# Patient Record
Sex: Male | Born: 1985 | Race: Black or African American | Hispanic: No | Marital: Single | Smoking: Current every day smoker
Health system: Southern US, Community
[De-identification: ages and names within clinical notes are randomized; demographics above are authoritative.]

---
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
## 2004-01-05 ENCOUNTER — Emergency Department (HOSPITAL_COMMUNITY): Admission: EM | Admit: 2004-01-05 | Discharge: 2004-01-05 | Payer: Self-pay

## 2004-07-22 ENCOUNTER — Emergency Department (HOSPITAL_COMMUNITY): Admission: EM | Admit: 2004-07-22 | Discharge: 2004-07-22 | Payer: Self-pay | Admitting: *Deleted

## 2004-08-10 ENCOUNTER — Emergency Department (HOSPITAL_COMMUNITY): Admission: EM | Admit: 2004-08-10 | Discharge: 2004-08-10 | Payer: Self-pay | Admitting: Emergency Medicine

## 2004-08-23 ENCOUNTER — Emergency Department (HOSPITAL_COMMUNITY): Admission: EM | Admit: 2004-08-23 | Discharge: 2004-08-23 | Payer: Self-pay | Admitting: Emergency Medicine

## 2004-09-15 ENCOUNTER — Emergency Department (HOSPITAL_COMMUNITY): Admission: EM | Admit: 2004-09-15 | Discharge: 2004-09-15 | Payer: Self-pay | Admitting: Emergency Medicine

## 2004-12-02 ENCOUNTER — Emergency Department (HOSPITAL_COMMUNITY): Admission: EM | Admit: 2004-12-02 | Discharge: 2004-12-02 | Payer: Self-pay | Admitting: Emergency Medicine

## 2013-08-06 ENCOUNTER — Emergency Department (HOSPITAL_COMMUNITY)
Admission: EM | Admit: 2013-08-06 | Discharge: 2013-08-06 | Discharge status: Against Medical Advice | Payer: Self-pay | Attending: Emergency Medicine | Admitting: Emergency Medicine

## 2013-08-06 ENCOUNTER — Encounter (HOSPITAL_COMMUNITY): Payer: Self-pay | Admitting: Emergency Medicine

## 2013-08-06 DIAGNOSIS — J029 Acute pharyngitis, unspecified: Secondary | ICD-10-CM | POA: Insufficient documentation

## 2013-08-06 DIAGNOSIS — F17200 Nicotine dependence, unspecified, uncomplicated: Secondary | ICD-10-CM | POA: Insufficient documentation

## 2013-08-06 LAB — RAPID STREP SCREEN (MED CTR MEBANE ONLY): Streptococcus, Group A Screen (Direct): NEGATIVE

## 2013-08-06 NOTE — ED Notes (Signed)
Sore throat for 2 weeks.  No temp

## 2013-08-06 NOTE — ED Notes (Signed)
Pt states that he is not waiting any longer. Pt ambulated out of room and was causing a scene at the nursing station about wait for test results. Pt knew that strep test was pending. Pt decided to walk out without results. Pt refusing to sign AMA papers. PA aware. Fayrene FearingJames RN witness to patient leaving AMA without signing.

## 2013-08-06 NOTE — ED Notes (Signed)
Pt states sore throat for a week. Pt has tried to use throat spray and tried to use nyquil without working.

## 2013-08-08 LAB — CULTURE, GROUP A STREP

## 2013-10-01 ENCOUNTER — Emergency Department (HOSPITAL_COMMUNITY)
Admission: EM | Admit: 2013-10-01 | Discharge: 2013-10-01 | Discharge status: Against Medical Advice | Payer: Self-pay | Attending: Emergency Medicine | Admitting: Emergency Medicine

## 2013-10-01 ENCOUNTER — Encounter (HOSPITAL_COMMUNITY): Payer: Self-pay | Admitting: Emergency Medicine

## 2013-10-01 ENCOUNTER — Emergency Department (HOSPITAL_COMMUNITY): Payer: Self-pay

## 2013-10-01 DIAGNOSIS — Z23 Encounter for immunization: Secondary | ICD-10-CM | POA: Insufficient documentation

## 2013-10-01 DIAGNOSIS — S0180XA Unspecified open wound of other part of head, initial encounter: Secondary | ICD-10-CM | POA: Insufficient documentation

## 2013-10-01 DIAGNOSIS — F17200 Nicotine dependence, unspecified, uncomplicated: Secondary | ICD-10-CM | POA: Insufficient documentation

## 2013-10-01 DIAGNOSIS — F1010 Alcohol abuse, uncomplicated: Secondary | ICD-10-CM | POA: Insufficient documentation

## 2013-10-01 DIAGNOSIS — S0181XA Laceration without foreign body of other part of head, initial encounter: Secondary | ICD-10-CM

## 2013-10-01 DIAGNOSIS — F10920 Alcohol use, unspecified with intoxication, uncomplicated: Secondary | ICD-10-CM

## 2013-10-01 MED ORDER — TETANUS-DIPHTH-ACELL PERTUSSIS 5-2.5-18.5 LF-MCG/0.5 IM SUSP
0.5000 mL | Freq: Once | INTRAMUSCULAR | Status: AC
  Administered 2013-10-01: 0.5 mL via INTRAMUSCULAR
  Filled 2013-10-01: qty 0.5

## 2013-10-01 MED ORDER — BACITRACIN 500 UNIT/GM EX OINT
1.0000 "application " | TOPICAL_OINTMENT | Freq: Once | CUTANEOUS | Status: DC
  Filled 2013-10-01: qty 28.4
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
## 2013-10-01 NOTE — ED Notes (Signed)
Tori, PA-C, at the bedside. Dr. Hyacinth MeekerMiller at the bedside.

## 2013-10-01 NOTE — ED Provider Notes (Signed)
Medical screening examination/treatment/procedure(s) were conducted as a shared visit with non-physician practitioner(s) and myself.  I personally evaluated the patient during the encounter  Please see my separate respective documentation pertaining to this patient encounter  Clinical Impression: Laceration of forehead, Head injury  Vida RollerBrian D Kolden Dupee, MD 10/01/13 [phone]

## 2013-10-01 NOTE — ED Notes (Signed)
Patient resisting CT testing.  Robert Wallace at the bedside to explain the risks.

## 2013-10-01 NOTE — ED Notes (Signed)
Suture cart at the bedside.  

## 2013-10-01 NOTE — ED Notes (Signed)
Family present to attempt to convince patient to stay. Patient still insisting on leaving.  Leaves with [employer] present.

## 2013-10-01 NOTE — ED Notes (Signed)
Patient refusing to stay for treatment.  Preparing to leave AMA. Dr. Hyacinth MeekerMiller aware.

## 2013-10-01 NOTE — ED Notes (Signed)
Patient too agitated for visitors at this time.
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
## 2013-10-01 NOTE — ED Notes (Signed)
Dr. Miller at the bedside.  

## 2013-11-04 ENCOUNTER — Emergency Department (HOSPITAL_COMMUNITY)
Admission: EM | Admit: 2013-11-04 | Discharge: 2013-11-04 | Disposition: A | Discharge status: Home / Self Care | Payer: Self-pay | Attending: Emergency Medicine | Admitting: Emergency Medicine

## 2013-11-04 ENCOUNTER — Encounter (HOSPITAL_COMMUNITY): Payer: Self-pay | Admitting: Emergency Medicine

## 2013-11-04 DIAGNOSIS — R45850 Homicidal ideations: Secondary | ICD-10-CM

## 2013-11-04 DIAGNOSIS — F1020 Alcohol dependence, uncomplicated: Secondary | ICD-10-CM | POA: Diagnosis present

## 2013-11-04 DIAGNOSIS — F1210 Cannabis abuse, uncomplicated: Secondary | ICD-10-CM | POA: Insufficient documentation

## 2013-11-04 DIAGNOSIS — F1994 Other psychoactive substance use, unspecified with psychoactive substance-induced mood disorder: Secondary | ICD-10-CM

## 2013-11-04 DIAGNOSIS — F17200 Nicotine dependence, unspecified, uncomplicated: Secondary | ICD-10-CM | POA: Insufficient documentation

## 2013-11-04 DIAGNOSIS — R454 Irritability and anger: Secondary | ICD-10-CM

## 2013-11-04 DIAGNOSIS — F10230 Alcohol dependence with withdrawal, uncomplicated: Secondary | ICD-10-CM

## 2013-11-04 DIAGNOSIS — F1410 Cocaine abuse, uncomplicated: Secondary | ICD-10-CM

## 2013-11-04 DIAGNOSIS — F1910 Other psychoactive substance abuse, uncomplicated: Secondary | ICD-10-CM

## 2013-11-04 DIAGNOSIS — F10929 Alcohol use, unspecified with intoxication, unspecified: Secondary | ICD-10-CM

## 2013-11-04 DIAGNOSIS — F911 Conduct disorder, childhood-onset type: Secondary | ICD-10-CM | POA: Insufficient documentation

## 2013-11-04 DIAGNOSIS — F1010 Alcohol abuse, uncomplicated: Secondary | ICD-10-CM | POA: Diagnosis present

## 2013-11-04 LAB — CBC
HCT: 44 % (ref 39.0–52.0)
Hemoglobin: 15.1 g/dL (ref 13.0–17.0)
MCH: 30.6 pg (ref 26.0–34.0)
MCHC: 34.3 g/dL (ref 30.0–36.0)
MCV: 89.1 fL (ref 78.0–100.0)
Platelets: 289 10*3/uL (ref 150–400)
RBC: 4.94 MIL/uL (ref 4.22–5.81)
RDW: 13.8 % (ref 11.5–15.5)
WBC: 7.3 10*3/uL (ref 4.0–10.5)

## 2013-11-04 LAB — SALICYLATE LEVEL: Salicylate Lvl: <2 mg/dL — ABNORMAL LOW (ref 2.8–20.0)

## 2013-11-04 LAB — COMPREHENSIVE METABOLIC PANEL
ALT: 33 U/L (ref 0–53)
AST: 48 U/L — ABNORMAL HIGH (ref 0–37)
Albumin: 4 g/dL (ref 3.5–5.2)
Alkaline Phosphatase: 90 U/L (ref 39–117)
Anion gap: 16 — ABNORMAL HIGH (ref 5–15)
BUN: 12 mg/dL (ref 6–23)
CO2: 25 mEq/L (ref 19–32)
Calcium: 9 mg/dL (ref 8.4–10.5)
Chloride: 100 mEq/L (ref 96–112)
Creatinine, Ser: 1.16 mg/dL (ref 0.50–1.35)
GFR calc Af Amer: >90 mL/min (ref >90)
GFR calc non Af Amer: 83 mL/min — ABNORMAL LOW (ref >90)
Glucose, Bld: 93 mg/dL (ref 70–99)
Potassium: 4 mEq/L (ref 3.7–5.3)
Sodium: 141 mEq/L (ref 137–147)
Total Bilirubin: <0.2 mg/dL — ABNORMAL LOW (ref 0.3–1.2)
Total Protein: 8.1 g/dL (ref 6.0–8.3)

## 2013-11-04 LAB — RAPID URINE DRUG SCREEN, HOSP PERFORMED
Amphetamines: NOT DETECTED
Barbiturates: NOT DETECTED
Benzodiazepines: NOT DETECTED
Cocaine: POSITIVE — AB
Opiates: NOT DETECTED
Tetrahydrocannabinol: POSITIVE — AB

## 2013-11-04 LAB — ETHANOL: Alcohol, Ethyl (B): 362 mg/dL — ABNORMAL HIGH (ref 0–11)

## 2013-11-04 LAB — ACETAMINOPHEN LEVEL: Acetaminophen (Tylenol), Serum: <15 ug/mL (ref 10–30)

## 2013-11-04 MED ORDER — LOPERAMIDE HCL 2 MG PO CAPS: 2.0000 mg | ORAL_CAPSULE | ORAL | Status: DC | PRN

## 2013-11-04 MED ORDER — ADULT MULTIVITAMIN W/MINERALS CH
1.0000 | ORAL_TABLET | Freq: Every day | ORAL | Status: DC
  Administered 2013-11-04: 1 via ORAL
  Filled 2013-11-04: qty 1

## 2013-11-04 MED ORDER — IBUPROFEN 200 MG PO TABS: 600.0000 mg | ORAL_TABLET | Freq: Three times a day (TID) | ORAL | Status: DC | PRN

## 2013-11-04 MED ORDER — CHLORDIAZEPOXIDE HCL 25 MG PO CAPS
25.0000 mg | ORAL_CAPSULE | Freq: Once | ORAL | Status: AC
  Administered 2013-11-04: 25 mg via ORAL
  Filled 2013-11-04: qty 1

## 2013-11-04 MED ORDER — VITAMIN B-1 100 MG PO TABS: 100.0000 mg | ORAL_TABLET | Freq: Every day | ORAL | Status: DC

## 2013-11-04 MED ORDER — CHLORDIAZEPOXIDE HCL 25 MG PO CAPS: 25.0000 mg | ORAL_CAPSULE | ORAL | Status: DC

## 2013-11-04 MED ORDER — THIAMINE HCL 100 MG/ML IJ SOLN: 100.0000 mg | Freq: Once | INTRAMUSCULAR | Status: DC

## 2013-11-04 MED ORDER — ONDANSETRON HCL 4 MG PO TABS
4.0000 mg | ORAL_TABLET | Freq: Three times a day (TID) | ORAL | Status: DC | PRN
Start: 2013-11-04 — End: 2013-11-04

## 2013-11-04 MED ORDER — NICOTINE 21 MG/24HR TD PT24
21.0000 mg | MEDICATED_PATCH | Freq: Every day | TRANSDERMAL | Status: DC
  Administered 2013-11-04: 21 mg via TRANSDERMAL
  Filled 2013-11-04: qty 1

## 2013-11-04 MED ORDER — CHLORDIAZEPOXIDE HCL 25 MG PO CAPS: 25.0000 mg | ORAL_CAPSULE | Freq: Every day | ORAL | Status: DC

## 2013-11-04 MED ORDER — CHLORDIAZEPOXIDE HCL 25 MG PO CAPS
25.0000 mg | ORAL_CAPSULE | Freq: Four times a day (QID) | ORAL | Status: DC
  Administered 2013-11-04: 25 mg via ORAL
  Filled 2013-11-04: qty 1

## 2013-11-04 MED ORDER — HYDROXYZINE HCL 25 MG PO TABS: 25.0000 mg | ORAL_TABLET | Freq: Four times a day (QID) | ORAL | Status: DC | PRN

## 2013-11-04 MED ORDER — CHLORDIAZEPOXIDE HCL 25 MG PO CAPS: 25.0000 mg | ORAL_CAPSULE | Freq: Four times a day (QID) | ORAL | Status: DC | PRN

## 2013-11-04 MED ORDER — ALUM & MAG HYDROXIDE-SIMETH 200-200-20 MG/5ML PO SUSP: 30.0000 mL | ORAL | Status: DC | PRN

## 2013-11-04 MED ORDER — CHLORDIAZEPOXIDE HCL 25 MG PO CAPS: 25.0000 mg | ORAL_CAPSULE | Freq: Three times a day (TID) | ORAL | Status: DC

## 2013-11-04 MED ORDER — ONDANSETRON 4 MG PO TBDP
4.0000 mg | ORAL_TABLET | Freq: Four times a day (QID) | ORAL | Status: DC | PRN
  Administered 2013-11-04: 4 mg via ORAL
  Filled 2013-11-04: qty 1

## 2013-11-04 NOTE — ED Notes (Signed)
Patient asked this Clinical research associate for a hug. It was explained that staff and patients can't hug. Patient responded with, "Well you can't blame a guy for trying."

## 2013-11-04 NOTE — ED Notes (Addendum)
Gagging, spitting in the trash can. Given green throw up bag. Will get him some Zofran.

## 2013-11-04 NOTE — ED Notes (Signed)
Pt has shoes, socks, shorts, cotton pants, shirt, hat. 2 gold tone necklaces. 1 gold tone ear ring and 2  Gold tone bracelets.
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
## 2013-11-04 NOTE — ED Notes (Signed)
D/C instructions reviewed with understanding stated. Denies pain. No complaints voiced. Ambulatory without difficulty. Belongings bag x2 returned. Escorted by mental health tech to front of hospital.
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
## 2013-11-04 NOTE — ED Notes (Signed)
Pt brought to ED by [employer] with thoughts of wanting to harm someone, pt unclear of who or how. Pt rambling stating he has mental problems and wants to "fuck people and things up" pt states he does not want to go back to jail so he wants help. Pt states he doesn't feel like his family takes care of him like he looks out for them. Pt states he has nightmares every night, hears voices during dreams. Pt denies SI. Pt admits drinking 3 beers today

## 2013-11-04 NOTE — [facility] Suicide Risk Assessment (Signed)
Suicide Risk Assessment  Discharge Assessment     Demographic Factors:  Male and Adolescent or young adult  Total Time spent with patient: 20 minutes  Psychiatric Specialty Exam:     Blood pressure 117/68, pulse 81, temperature 97.7 F (36.5 C), temperature source Oral, resp. rate 18, height  (1.803 m), weight 200 lb (90.719 kg), SpO2 99.00%.Body mass index is 27.91 kg/(m^2).  General Appearance: Casual  Eye Contact::  Good  Speech:  Normal Rate  Volume:  Normal  Mood:  Euthymic  Affect:  Congruent  Thought Process:  Coherent  Orientation:  Full (Time, Place, and Person)  Thought Content:  WDL  Suicidal Thoughts:  No  Homicidal Thoughts:  No  Memory:  Immediate;   Good Recent;   Good Remote;   Good  Judgement:  Fair  Insight:  Fair  Psychomotor Activity:  Normal  Concentration:  Good  Recall:  Good  Fund of Knowledge:Good  Language: Good  Akathisia:  No  Handed:  Right  AIMS (if indicated):     Assets:  Architect Housing Leisure Time Physical Health Resilience Social Support Talents/Skills Transportation Vocational/Educational  Sleep:      Musculoskeletal: Strength & Muscle Tone: within normal limits Gait & Station: normal Patient leans: N/A   Mental Status Per Nursing Assessment::   On Admission:   alcohol intoxication  Current Mental Status by Physician: NA  Loss Factors: NA  Historical Factors: NA  Risk Reduction Factors:   Sense of responsibility to family, Employed, Living with another person, especially a relative, Positive social support and Positive coping skills or problem solving skills  Continued Clinical Symptoms:  None  Cognitive Features That Contribute To Risk:  None  Suicide Risk:  Minimal: No identifiable suicidal ideation.  Patients presenting with no risk factors but with morbid ruminations; may be classified as minimal risk based on the severity of the depressive  symptoms  Discharge Diagnoses:   AXIS I:  Alcohol Abuse AXIS II:  Deferred AXIS III:  History reviewed. No pertinent past medical history. AXIS IV:  other psychosocial or environmental problems and problems related to social environment AXIS V:  61-70 mild symptoms  Plan Of Care/Follow-up recommendations:  Activity:  as tolerated Diet:  low-sodium heart healthy diet  Is patient on multiple antipsychotic therapies at discharge:  No   Has Patient had three or more failed trials of antipsychotic monotherapy by history:  No  Recommended Plan for Multiple Antipsychotic Therapies: NA    Robert Wallace, PMH-NP 11/04/2013, 12:47 PM
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
## 2014-08-16 ENCOUNTER — Emergency Department (HOSPITAL_COMMUNITY): Payer: Self-pay

## 2014-08-16 ENCOUNTER — Encounter (HOSPITAL_COMMUNITY): Payer: Self-pay | Admitting: *Deleted

## 2014-08-16 ENCOUNTER — Emergency Department (HOSPITAL_COMMUNITY)
Admission: EM | Admit: 2014-08-16 | Discharge: 2014-08-16 | Disposition: A | Discharge status: Home / Self Care | Payer: Self-pay | Attending: Emergency Medicine | Admitting: Emergency Medicine

## 2014-08-16 DIAGNOSIS — Y929 Unspecified place or not applicable: Secondary | ICD-10-CM | POA: Insufficient documentation

## 2014-08-16 DIAGNOSIS — W231XXA Caught, crushed, jammed, or pinched between stationary objects, initial encounter: Secondary | ICD-10-CM | POA: Insufficient documentation

## 2014-08-16 DIAGNOSIS — Z87828 Personal history of other (healed) physical injury and trauma: Secondary | ICD-10-CM | POA: Insufficient documentation

## 2014-08-16 DIAGNOSIS — Y999 Unspecified external cause status: Secondary | ICD-10-CM | POA: Insufficient documentation

## 2014-08-16 DIAGNOSIS — Y939 Activity, unspecified: Secondary | ICD-10-CM | POA: Insufficient documentation

## 2014-08-16 DIAGNOSIS — S6991XA Unspecified injury of right wrist, hand and finger(s), initial encounter: Secondary | ICD-10-CM | POA: Insufficient documentation

## 2014-08-16 DIAGNOSIS — M79641 Pain in right hand: Secondary | ICD-10-CM

## 2014-08-16 DIAGNOSIS — Z720 Tobacco use: Secondary | ICD-10-CM | POA: Insufficient documentation

## 2014-08-16 MED ORDER — NAPROXEN 500 MG PO TABS: 500.0000 mg | ORAL_TABLET | Freq: Two times a day (BID) | ORAL | Status: AC | PRN

## 2014-08-16 NOTE — Discharge Instructions (Signed)
Read the information below.  Use the prescribed medication as directed.  Please discuss all new medications with your pharmacist.  You may return to the Emergency Department at any time for worsening condition or any new symptoms that concern you.   If you develop uncontrolled pain, weakness or numbness of the extremity, severe discoloration of the skin, or you are unable to move your fingers, return to the ER for a recheck.      Musculoskeletal Pain Musculoskeletal pain is muscle and boney aches and pains. These pains can occur in any part of the body. Your caregiver may treat you without knowing the cause of the pain. They may treat you if blood or urine tests, X-rays, and other tests were normal.  CAUSES There is often not a definite cause or reason for these pains. These pains may be caused by a type of germ (virus). The discomfort may also come from overuse. Overuse includes working out too hard when your body is not fit. Boney aches also come from weather changes. Bone is sensitive to atmospheric pressure changes. HOME CARE INSTRUCTIONS   Ask when your test results will be ready. Make sure you get your test results.  Only take over-the-counter or prescription medicines for pain, discomfort, or fever as directed by your caregiver. If you were given medications for your condition, do not drive, operate machinery or power tools, or sign legal documents for 24 hours. Do not drink alcohol. Do not take sleeping pills or other medications that may interfere with treatment.  Continue all activities unless the activities cause more pain. When the pain lessens, slowly resume normal activities. Gradually increase the intensity and duration of the activities or exercise.  During periods of severe pain, bed rest may be helpful. Lay or sit in any position that is comfortable.  Putting ice on the injured area.  Put ice in a bag.  Place a towel between your skin and the bag.  Leave the ice on for 15 to  20 minutes, 3 to 4 times a day.  Follow up with your caregiver for continued problems and no reason can be found for the pain. If the pain becomes worse or does not go away, it may be necessary to repeat tests or do additional testing. Your caregiver may need to look further for a possible cause. SEEK IMMEDIATE MEDICAL CARE IF:  You have pain that is getting worse and is not relieved by medications.  You develop chest pain that is associated with shortness or breath, sweating, feeling sick to your stomach (nauseous), or throw up (vomit).  Your pain becomes localized to the abdomen.  You develop any new symptoms that seem different or that concern you. MAKE SURE YOU:   Understand these instructions.  Will watch your condition.  Will get help right away if you are not doing well or get worse. Document Released: 02/09/2005 Document Revised: 05/04/2011 Document Reviewed: 10/14/2012 [facility] Patient Information 2015 [place], [place]. This information is not intended to replace advice given to you by your health care provider. Make sure you discuss any questions you have with your health care provider.

## 2014-08-16 NOTE — ED Notes (Signed)
Pt reports pain to RT middle and index finger that started 2 days ago.

## 2014-08-16 NOTE — ED Provider Notes (Signed)
CSN: [id number]     Arrival date & time 08/16/14  1413 History  This chart was scribed for non-physician practitioner, Trixie Dredge, PA-C, working with Mancel Bale, MD, by Budd Palmer ED Scribe. This patient was seen in room TR02C/TR02C and the patient's care was started at 2:59 PM    Chief Complaint  Patient presents with  . Finger Injury   The history is provided by the patient. No language interpreter was used.   HPI Comments: Robert Wallace is a 29 y.o. male who presents to the Emergency Department complaining of 2 finger injuries to the right hand. He complains of worsening, constant, aching pain of the right, third finger onset 2 days ago after being jammed in a door. He also reports exacerbation (onset 2 days ago) of previous aching, constant, pain to the right, second finger onset 2 months ago after jamming his finger with a basketball. He reports not being able to make a tight fist with his right hand, which is why he came to the ED today. He requires the use of his right hand for work in the [employer]. He denies weakness or numbness, as well as any associated injuries, no wounds.  History reviewed. No pertinent past medical history. History reviewed. No pertinent past surgical history. History reviewed. No pertinent family history. History  Substance Use Topics  . Smoking status: Current Every Day Smoker  . Smokeless tobacco: Never Used  . Alcohol Use: Yes    Review of Systems  Constitutional: Negative for fever.  Musculoskeletal: Positive for myalgias, joint swelling and arthralgias.  Skin: Negative for color change, pallor and wound.  Allergic/Immunologic: Negative for immunocompromised state.  Neurological: Negative for weakness and numbness.  Hematological: Does not bruise/bleed easily.  Psychiatric/Behavioral: Positive for self-injury (accidental).    Allergies  Review of patient's allergies indicates no known allergies.  Home Medications   Prior to  Admission medications   Medication Sig Start Date End Date Taking? Authorizing Provider  naproxen (NAPROSYN) 500 MG tablet Take 1 tablet (500 mg total) by mouth 2 (two) times daily as needed. 08/16/14   Trixie Dredge, PA-C   BP 138/93 mmHg  Pulse 79  Temp(Src) 98.6 F (37 C) (Oral)  Resp 18  SpO2 100% Physical Exam  Constitutional: He appears well-developed and well-nourished. No distress.  HENT:  Head: Normocephalic and atraumatic.  Neck: Neck supple.  Pulmonary/Chest: Effort normal.  Musculoskeletal:  RIGHT hand: Tenderness over the second PIP.  No other bony tenderness throughout hand.  No erythema, edema, warmth, or tenderness  Full active range of motion of all digits, strength 5/5, sensation intact, capillary refill < 2 seconds.   Full flexion, nearly full extension of 2nd and 3rd fingers  Neurological: He is alert.  Skin: He is not diaphoretic.  Psychiatric: He has a normal mood and affect. His behavior is normal.  Nursing note and vitals reviewed.   ED Course  Procedures  DIAGNOSTIC STUDIES: Oxygen Saturation is 100% on RA, normal by my interpretation.    COORDINATION OF CARE: 3:03 PM - Discussed plans to order diagnostic imaging and naproxen. Pt advised of plan for treatment and pt agrees.  Labs Review Labs Reviewed - No data to display  Imaging Review Dg Hand Complete Right  08/16/2014   CLINICAL DATA:  Jammed fingers 2 months ago, pain mostly at the PIP joints of the RIGHT index and middle fingers off and on since, struck fingers again yesterday with increased pain and swelling, unable to make  tight cyst  EXAM: RIGHT HAND - COMPLETE 3+ VIEW  COMPARISON:  08/23/2004  FINDINGS: Fingers superimposed on lateral view limiting assessment.  Osseous mineralization normal.  Degenerative changes at first MCP joint with joint space narrowing.  No acute fracture, dislocation, or bone destruction.  Soft tissue swelling overlying the MCP joints on lateral view.  IMPRESSION: No acute  osseous abnormalities.  Degenerative changes first MCP joint.   Electronically Signed   By: Ulyses Southward M.D.   On: 08/16/2014 15:02     EKG Interpretation None      MDM   Final diagnoses:  Right hand pain   Afebrile, nontoxic patient with injury to his index finger 2 months ago while playing basketball and 2 days ago  while closing a door.   Xray negative.  Neurovascularly intact.   D/C home with naprosyn, hand follow up PRN given pt's chronic discomfort and feeling he is unable to fully extend his index finger after basketball injury.  Discussed result, findings, treatment, and follow up  with patient.  Pt given return precautions.  Pt verbalizes understanding and agrees with plan.      I personally performed the services described in this documentation, which was scribed in my presence. The recorded information has been reviewed and is accurate.    Trixie Dredge, PA-C 08/16/14 1800  Mancel Bale, MD 08/20/14 [phone]

## 2014-08-16 NOTE — ED Notes (Signed)
Declined W/C at D/C and was escorted to lobby by RN. 

## 2016-10-25 IMAGING — DX DG HAND COMPLETE 3+V*R*
3 series · 3 of 3 positions shown · non-contrast
Comparison: 08/23/2004

CLINICAL DATA: Jammed fingers 2 months ago, pain mostly at the PIP
joints of the RIGHT index and middle fingers off and on since,
struck fingers again yesterday with increased pain and swelling,
unable to make tight cyst

EXAM:
RIGHT HAND - COMPLETE 3+ VIEW

[hand pa]
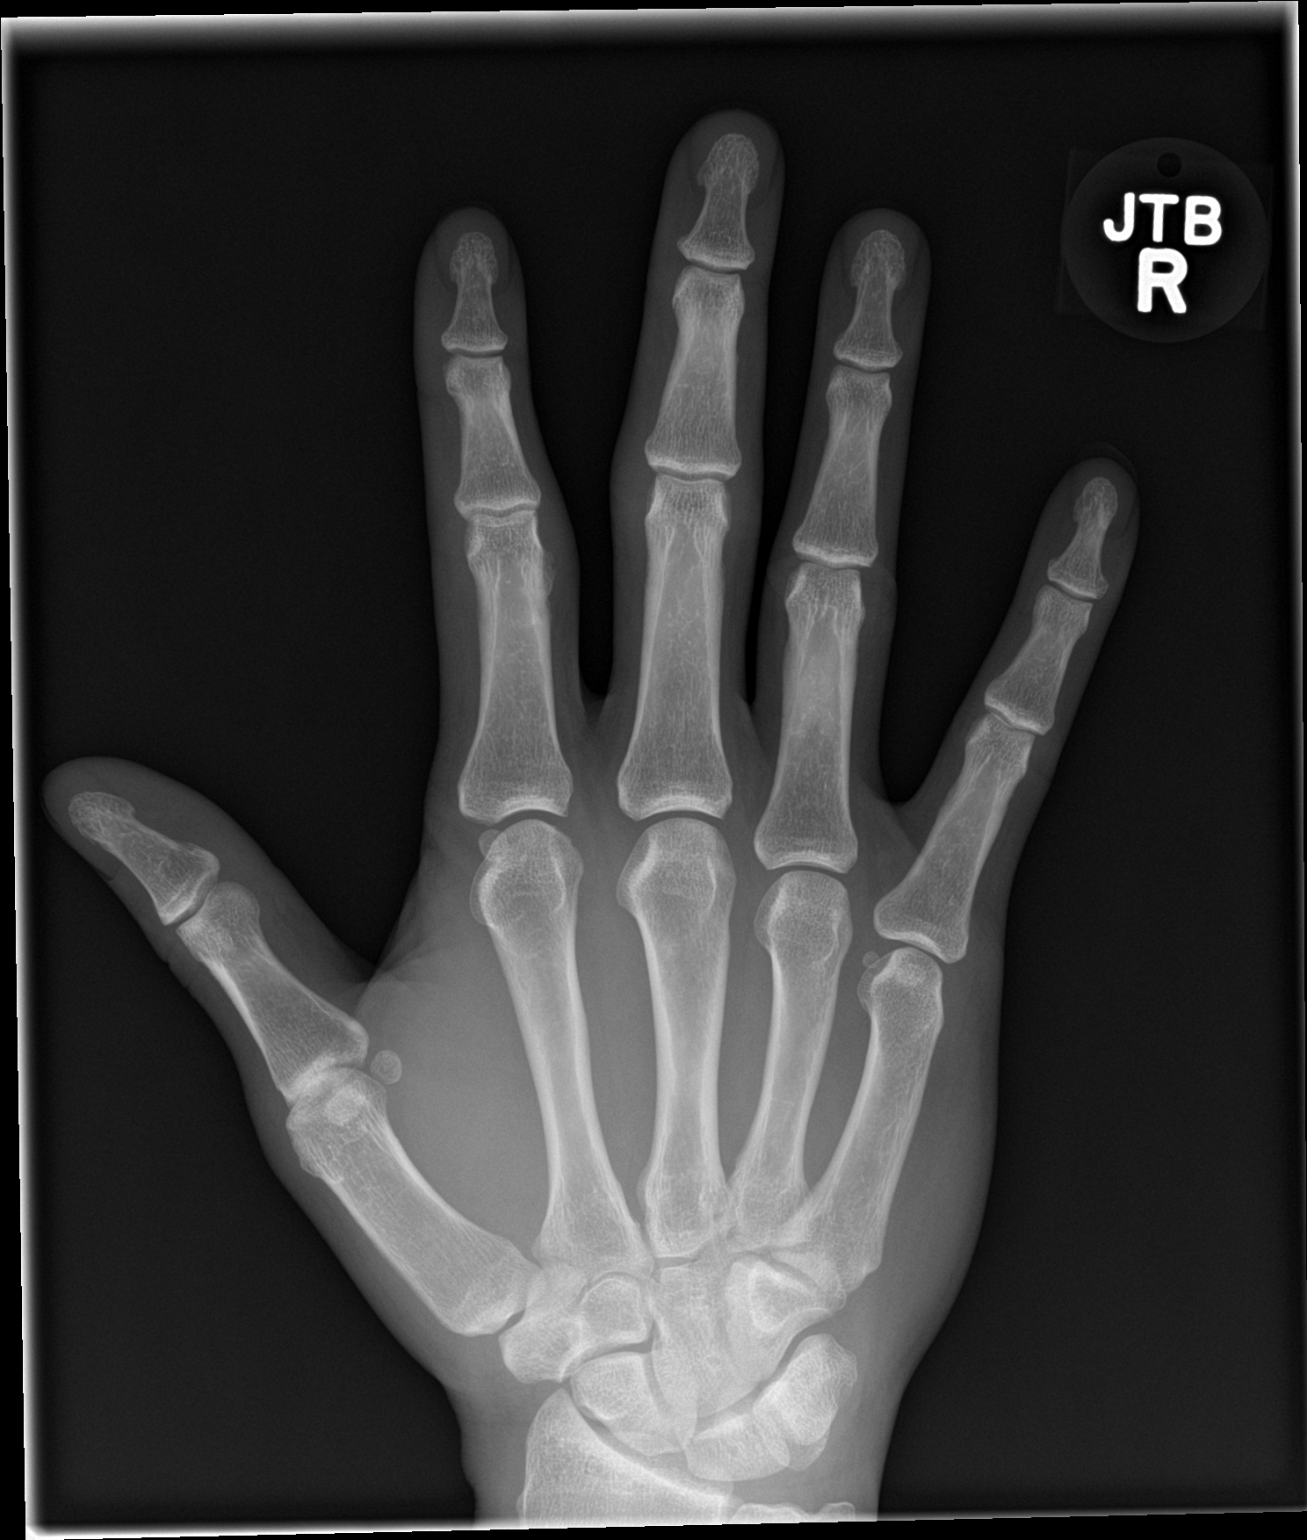

[hand obl]
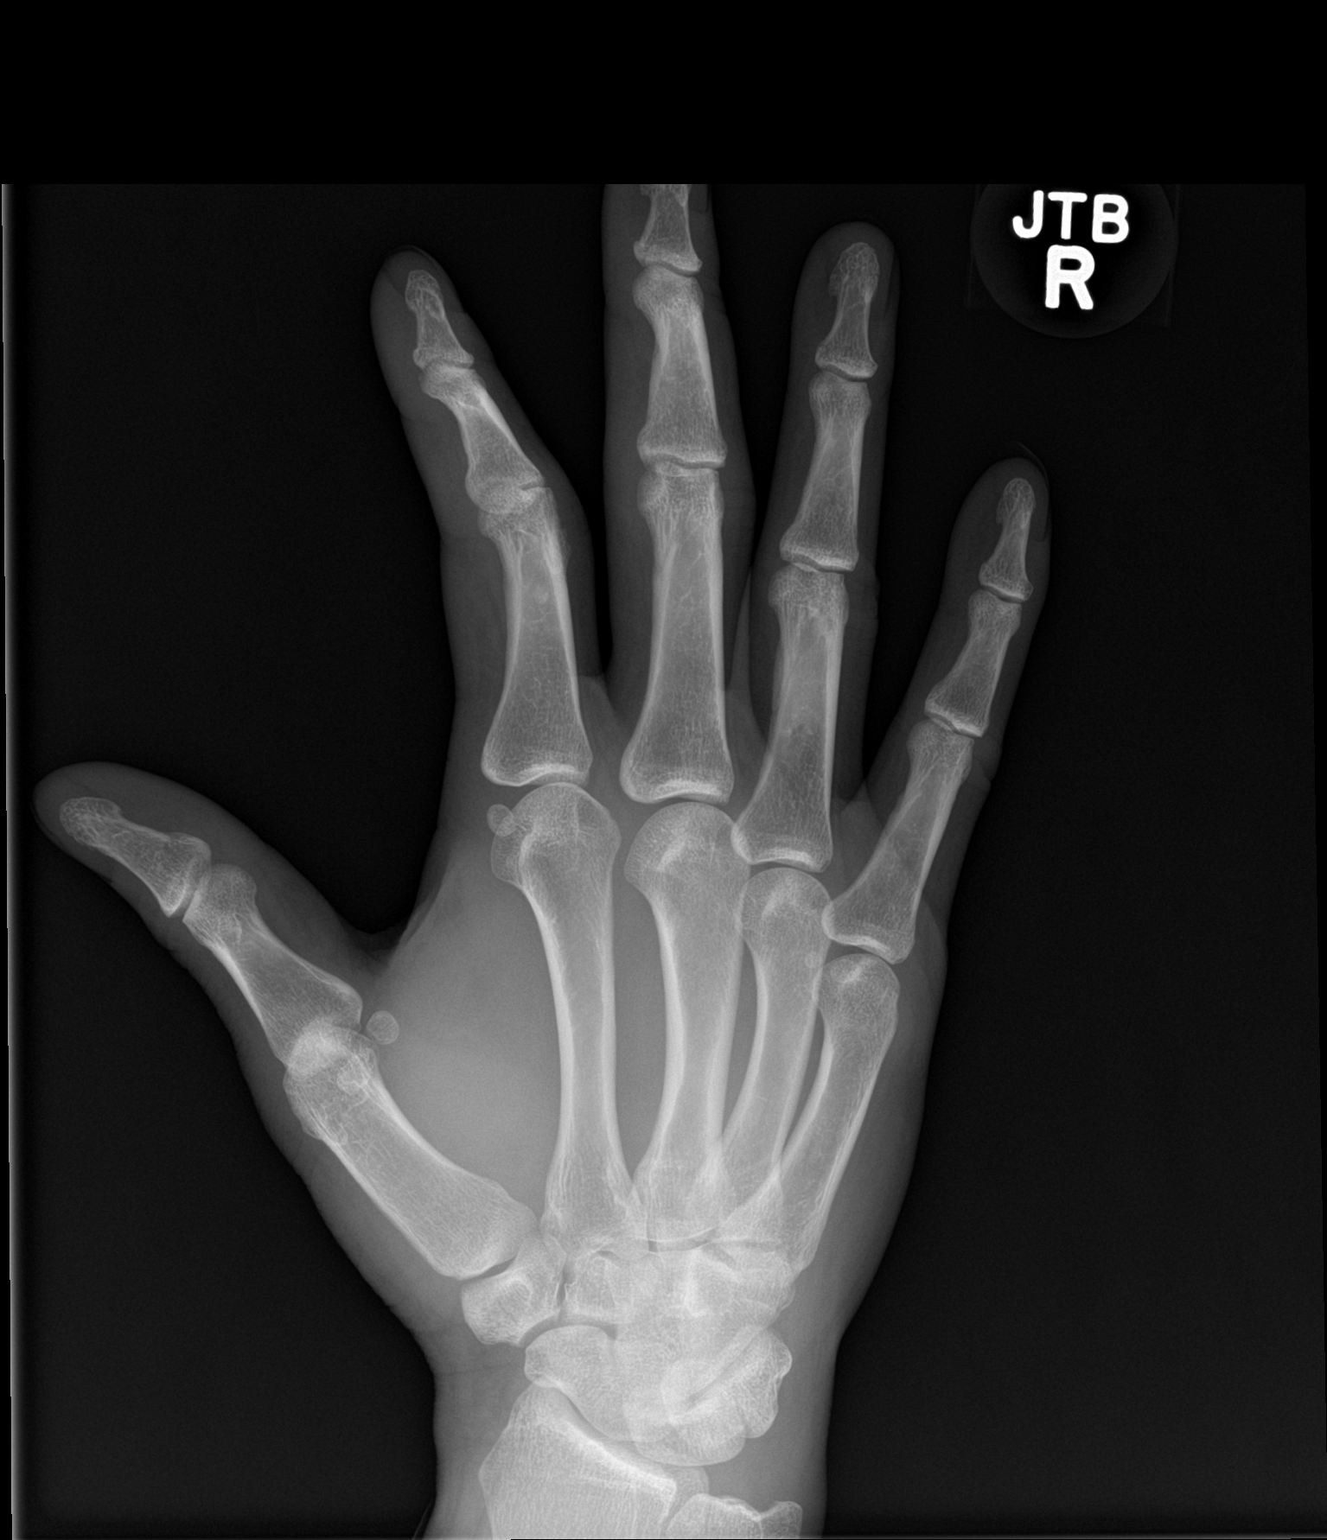

[hand lat]
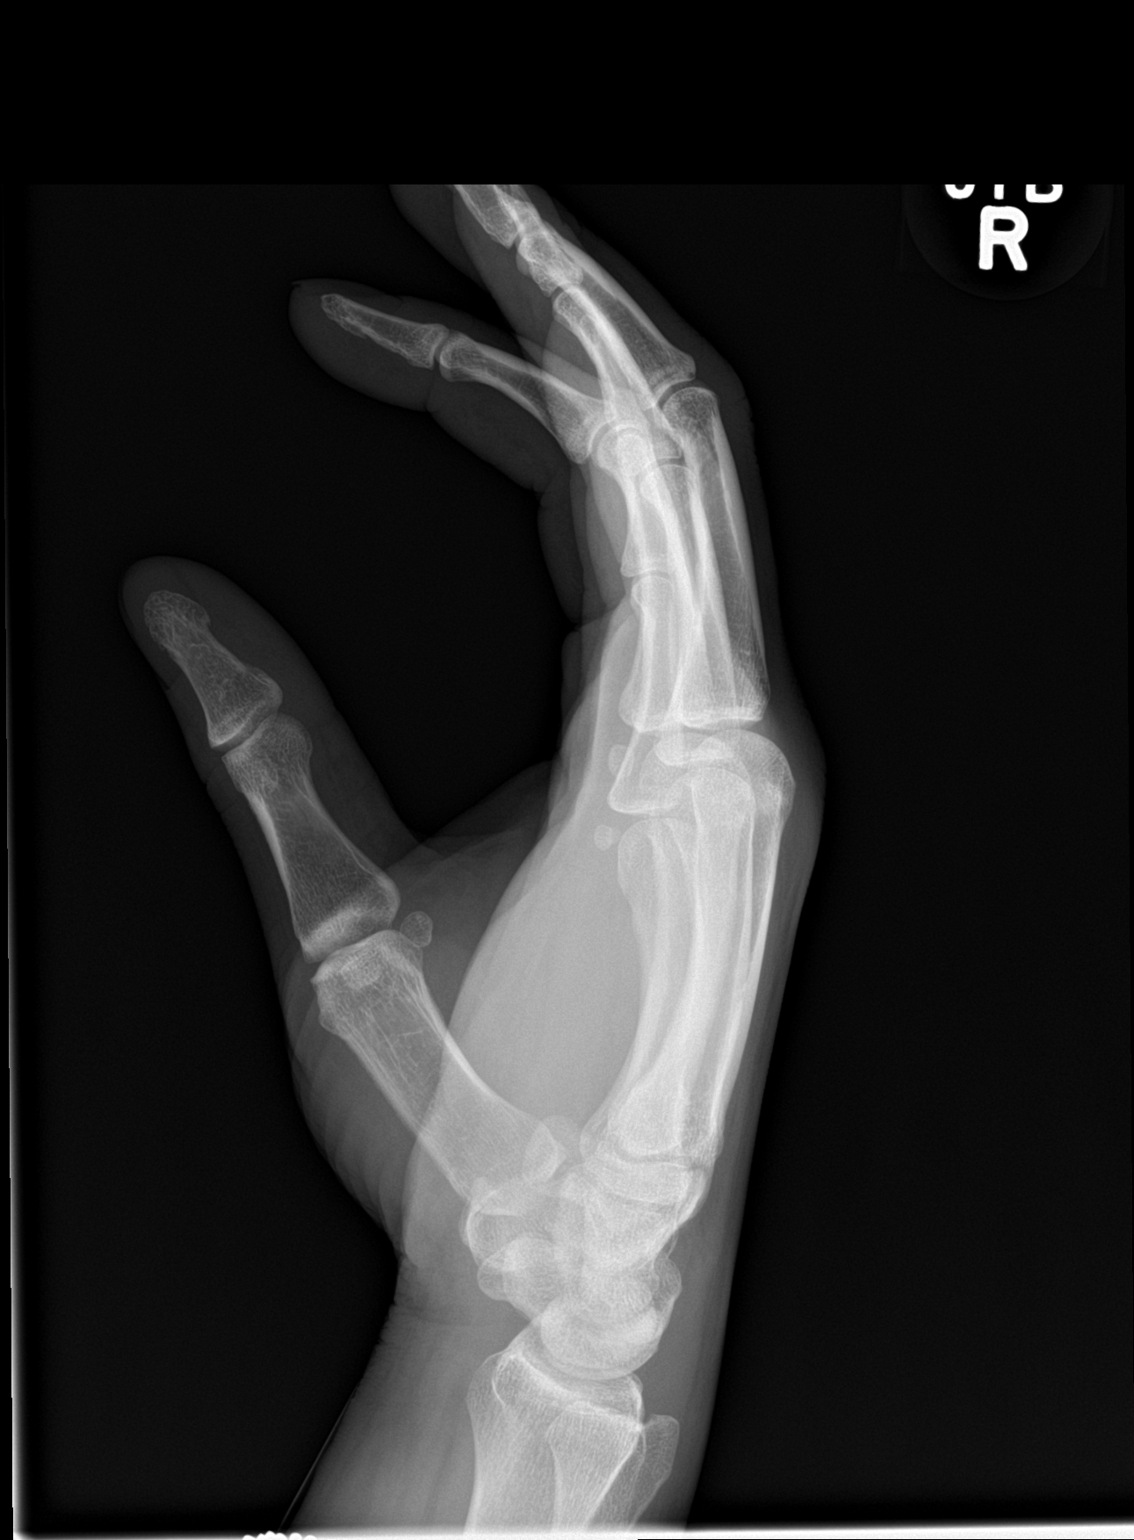

[3 of 3 positions shown; findings below may reference images not displayed]

FINDINGS: Fingers superimposed on lateral view limiting assessment.

Osseous mineralization normal.

Degenerative changes at first MCP joint with joint space narrowing.

No acute fracture, dislocation, or bone destruction.

Soft tissue swelling overlying the MCP joints on lateral view.
IMPRESSION: No acute osseous abnormalities.

Degenerative changes first MCP joint.
